# Patient Record
Sex: Male | Born: 1990 | Race: White | Hispanic: No | Smoking: Current every day smoker
Health system: Southern US, Community
[De-identification: ages and names within clinical notes are randomized; demographics above are authoritative.]

## PROBLEM LIST (undated history)

## (undated) DIAGNOSIS — N289 Disorder of kidney and ureter, unspecified: Secondary | ICD-10-CM

## (undated) HISTORY — PX: CYST REMOVAL TRUNK: SHX6283

## (undated) HISTORY — PX: CYSTOSCOPY WITH STENT PLACEMENT: SHX5790

---

## 2016-07-02 ENCOUNTER — Emergency Department (HOSPITAL_COMMUNITY): Payer: Non-veteran care

## 2016-07-02 ENCOUNTER — Emergency Department (HOSPITAL_COMMUNITY)
Admission: EM | Admit: 2016-07-02 | Discharge: 2016-07-02 | Disposition: A | Discharge status: Home / Self Care | Payer: Non-veteran care | Attending: Emergency Medicine | Admitting: Emergency Medicine

## 2016-07-02 ENCOUNTER — Encounter (HOSPITAL_COMMUNITY): Payer: Self-pay | Admitting: *Deleted

## 2016-07-02 DIAGNOSIS — F17210 Nicotine dependence, cigarettes, uncomplicated: Secondary | ICD-10-CM | POA: Diagnosis not present

## 2016-07-02 DIAGNOSIS — Z79899 Other long term (current) drug therapy: Secondary | ICD-10-CM | POA: Insufficient documentation

## 2016-07-02 DIAGNOSIS — R109 Unspecified abdominal pain: Secondary | ICD-10-CM | POA: Diagnosis not present

## 2016-07-02 HISTORY — DX: Disorder of kidney and ureter, unspecified: N28.9

## 2016-07-02 LAB — URINALYSIS, ROUTINE W REFLEX MICROSCOPIC
BILIRUBIN URINE: NEGATIVE
GLUCOSE, UA: NEGATIVE mg/dL
Hgb urine dipstick: NEGATIVE
Ketones, ur: NEGATIVE mg/dL
Leukocytes, UA: NEGATIVE
Nitrite: NEGATIVE
PH: 6 (ref 5.0–8.0)
Protein, ur: NEGATIVE mg/dL
SPECIFIC GRAVITY, URINE: 1.025 (ref 1.005–1.030)

## 2016-07-02 MED ORDER — NAPROXEN 500 MG PO TABS: 500.0000 mg | ORAL_TABLET | Freq: Two times a day (BID) | ORAL | 0 refills | Status: AC

## 2016-07-02 MED ORDER — TRAMADOL HCL 50 MG PO TABS: 50.0000 mg | ORAL_TABLET | Freq: Four times a day (QID) | ORAL | 0 refills | Status: AC | PRN

## 2016-07-02 MED ORDER — IBUPROFEN 800 MG PO TABS
800.0000 mg | ORAL_TABLET | Freq: Once | ORAL | Status: AC
  Administered 2016-07-02: 800 mg via ORAL
  Filled 2016-07-02: qty 1

## 2016-07-02 NOTE — Discharge Instructions (Signed)
Apply ice several times a day. Take acetaminophen as needed.

## 2016-07-02 NOTE — ED Triage Notes (Signed)
Pt c/o left flank pain that started today; pt denies any urinary sx

## 2016-07-02 NOTE — ED Provider Notes (Signed)
[facility]EM[facility]ENCY DEPT Provider Note   CSN: [id number] Arrival date & time: 07/02/16  0403     History   Chief Complaint Chief Complaint  Patient presents with  . Flank Pain    HPI Thomas Vance is a 25 y.o. male.He has a history of kidney stones and has been having left flank pain since 6 PM which is typical of his kidney stones. Pain is worse with movement or with coughing. He rates pain at 6/10. There is no radiation of pain. There's no associated nausea or vomiting. He denies any dysuria. He has not taken any medication for.  The history is provided by the patient.    Past Medical History:  Diagnosis Date  . Renal disorder    kidney stones    There are no active problems to display for this patient.   Past Surgical History:  Procedure Laterality Date  . CYST REMOVAL TRUNK    . CYSTOSCOPY WITH STENT PLACEMENT Bilateral        Home Medications    Prior to Admission medications   Medication Sig Start Date End Date Taking? Authorizing Provider  allopurinol (ZYLOPRIM) 100 MG tablet Take 100 mg by mouth daily.   Yes Historical Provider, MD  gabapentin (NEURONTIN) 300 MG capsule Take 300 mg by mouth 3 (three) times daily.   Yes Historical Provider, MD  lithium carbonate 300 MG capsule Take 300 mg by mouth 3 (three) times daily with meals.   Yes Historical Provider, MD    Family History History reviewed. No pertinent family history.  Social History Social History  Substance Use Topics  . Smoking status: Current Every Day Smoker    Packs/day: 1.00    Types: Cigarettes  . Smokeless tobacco: Current User    Types: Chew  . Alcohol use Yes     Comment: occasionally     Allergies   Review of patient's allergies indicates no known allergies.   Review of Systems Review of Systems  All other systems reviewed and are negative.    Physical Exam Updated Vital Signs BP 135/90 (BP Location: Left Arm)   Pulse 115   Temp 98.2 F (36.8 C) (Oral)   Resp 20    Ht 6' (1.829 m)   Wt 270 lb (122.5 kg)   SpO2 96%   BMI 36.62 kg/m   Physical Exam  Nursing note and vitals reviewed.  25 year old male, resting comfortably and in no acute distress. Vital signs are significant for tachycardia. Oxygen saturation is 96 %, which is normal. Head is normocephalic and atraumatic. PERRLA, EOMI. Oropharynx is clear. Neck is nontender and supple without adenopathy or JVD. Back is nontender and there is no CVA tenderness. Lungs are clear without rales, wheezes, or rhonchi. Chest is nontender. Heart has regular rate and rhythm without murmur. Abdomen is soft, flat, nontender without masses or hepatosplenomegaly and peristalsis is normoactive. Extremities have no cyanosis or edema, full range of motion is present. Skin is warm and dry without rash. Neurologic: Mental status is normal, cranial nerves are intact, there are no motor or sensory deficits.  ED Treatments / Results  Labs (all labs ordered are listed, but only abnormal results are displayed) Labs Reviewed  URINALYSIS, ROUTINE W REFLEX MICROSCOPIC (NOT AT [facility])    Radiology Ct Renal Stone Study  Result Date: 07/02/2016 CLINICAL DATA:  Left flank pain starting today. Previous history of kidney stones. EXAM: CT ABDOMEN AND PELVIS WITHOUT CONTRAST TECHNIQUE: Multidetector CT imaging of the abdomen and pelvis  was performed following the standard protocol without IV contrast. COMPARISON:  None. FINDINGS: Lower chest: No acute abnormality. Hepatobiliary: Unenhanced appearance is unremarkable. Pancreas: Unenhanced appearance is unremarkable. Spleen: Unenhanced appearance is unremarkable. Adrenals/Urinary Tract: Kidneys are symmetrical in size and shape. No hydronephrosis or hydroureter. No renal, ureteral, or bladder stones. Bladder wall is not thickened. No adrenal gland nodules. Stomach/Bowel: Stomach and small bowel and colon are mostly decompressed. Vascular/Lymphatic: Normal caliber abdominal aorta. No  significant lymphadenopathy. Reproductive: Prostate gland is not enlarged. Other: No free air or free fluid in the abdomen. Musculoskeletal: No destructive bone lesions. IMPRESSION: No renal or ureteral stone or obstruction. No acute process identified on noncontrast imaging. Electronically Signed   By: Burman NievesWilliam  Stevens M.D.   On: 07/02/2016 05:15    Procedures Procedures (including critical care time)  Medications Ordered in ED Medications  ibuprofen (ADVIL,MOTRIN) tablet 800 mg (not administered)     Initial Impression / Assessment and Plan / ED Course  I have reviewed the triage vital signs and the nursing notes.  Pertinent labs & imaging results that were available during my care of the patient were reviewed by me and considered in my medical decision making (see chart for details).  Clinical Course   Left flank pain, rule out renal colic. He is sent for CT renal stone protocol which shows no evidence of urolithiasis. Urinalysis is completely normal. Pain is most likely musculoskeletal. He is discharged with prescriptions for naproxen and tramadol. Referred back to his PCP for follow-up.  Final Clinical Impressions(s) / ED Diagnoses   Final diagnoses:  Left flank pain    New Prescriptions New Prescriptions   NAPROXEN (NAPROSYN) 500 MG TABLET    Take 1 tablet (500 mg total) by mouth 2 (two) times daily.   TRAMADOL (ULTRAM) 50 MG TABLET    Take 1 tablet (50 mg total) by mouth every 6 (six) hours as needed.     Dione Boozeavid Nalany Steedley, MD 07/02/16 [id number]

## 2017-08-19 IMAGING — CT CT RENAL STONE PROTOCOL
2 of 3 series · 16 of 46 positions shown, 18 images · non-contrast
Comparison: None.

CLINICAL DATA: Left flank pain starting today. Previous history of
kidney stones.

EXAM:
CT ABDOMEN AND PELVIS WITHOUT CONTRAST
TECHNIQUE: Multidetector CT imaging of the abdomen and pelvis was performed
following the standard protocol without IV contrast.

[Series 2: routine abd pel with · axial · 0.89mm/px · z∈[-598,-38]mm · 13 of 130 slices shown, 15 images]
[im 9/130  soft-tissue]
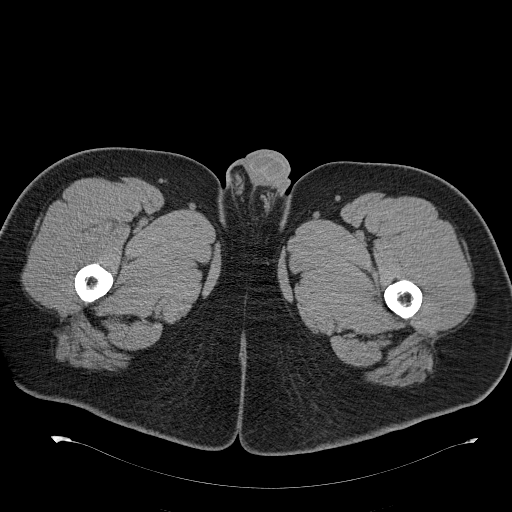
[im 9/130  bone]
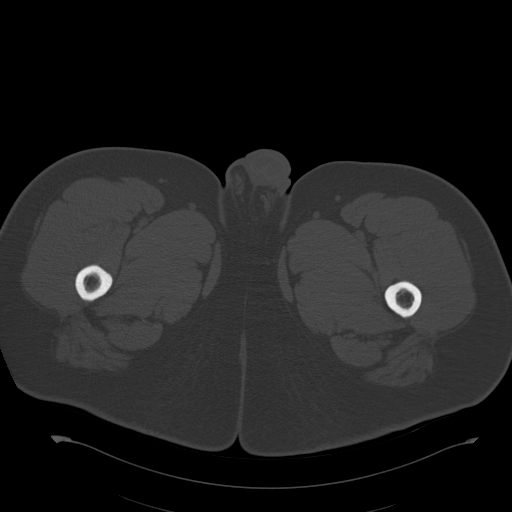
[im 17/130  soft-tissue]
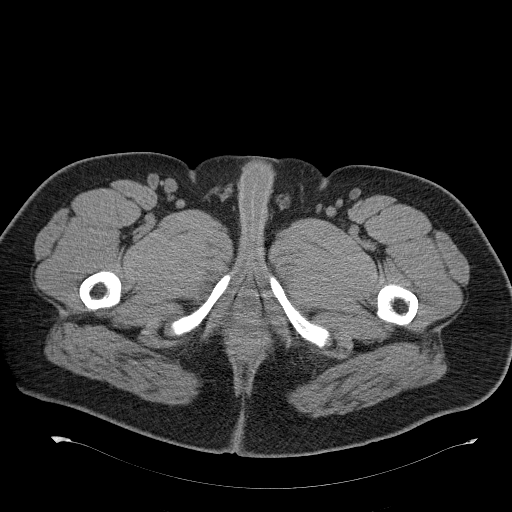
[im 25/130  soft-tissue]
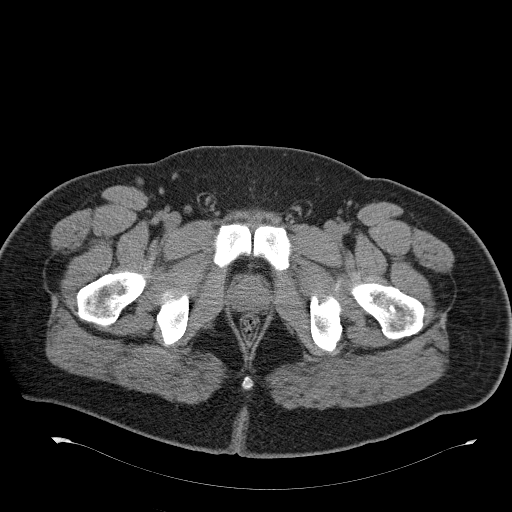
[im 38/130  soft-tissue]
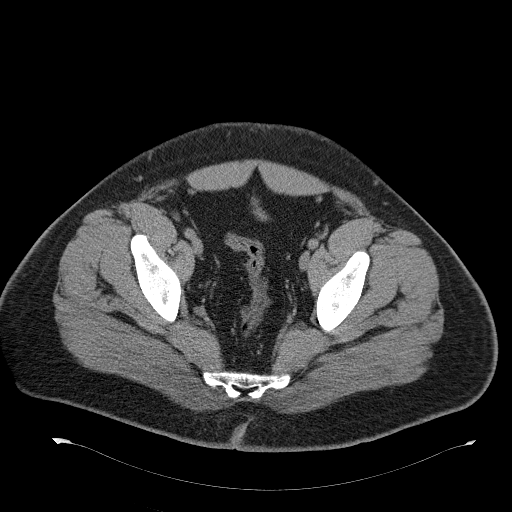
[im 46/130  soft-tissue]
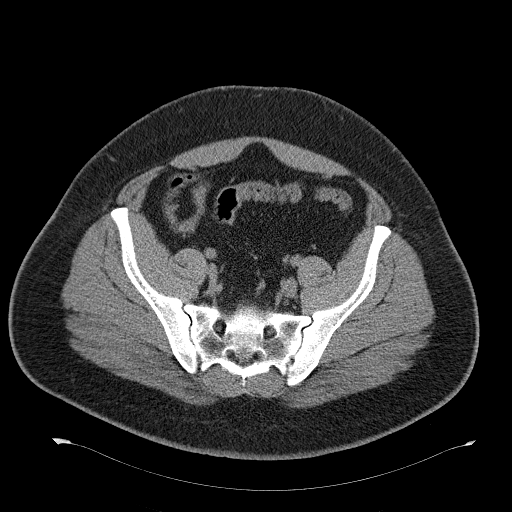
[im 55/130  soft-tissue]
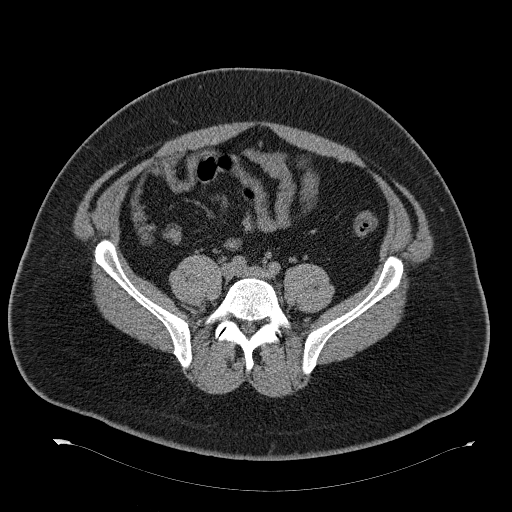
[im 67/130  soft-tissue]
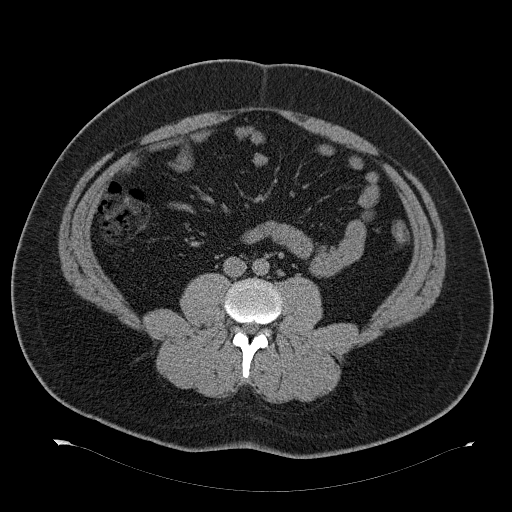
[im 75/130  soft-tissue]
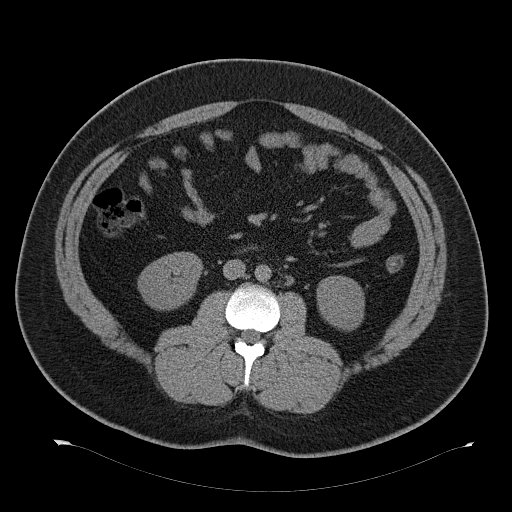
[im 84/130  soft-tissue]
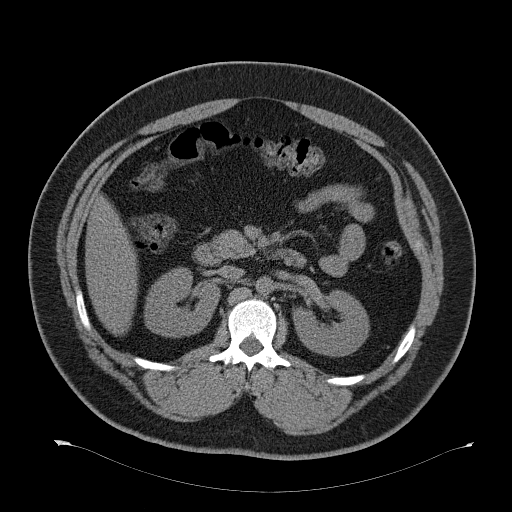
[im 84/130  bone]
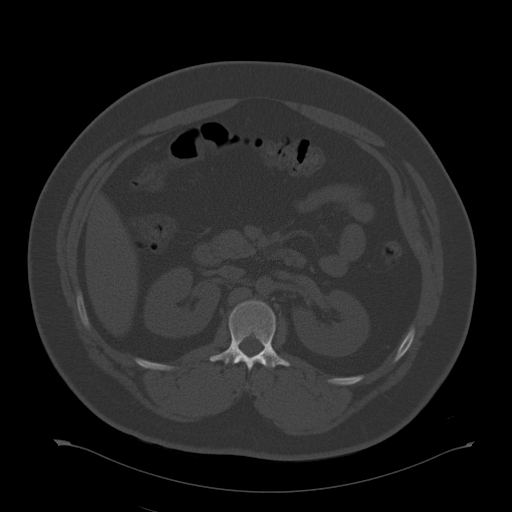
[im 92/130  soft-tissue]
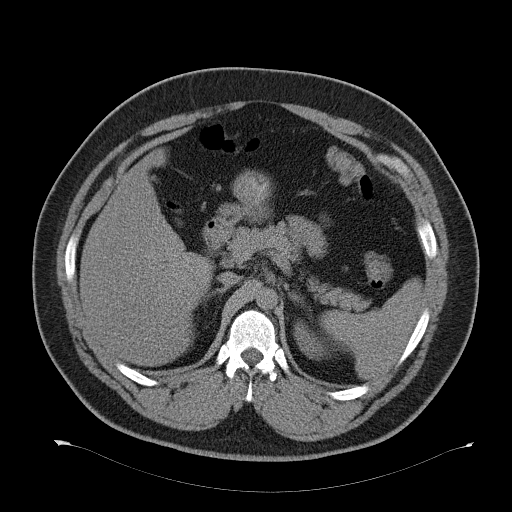
[im 105/130  soft-tissue]
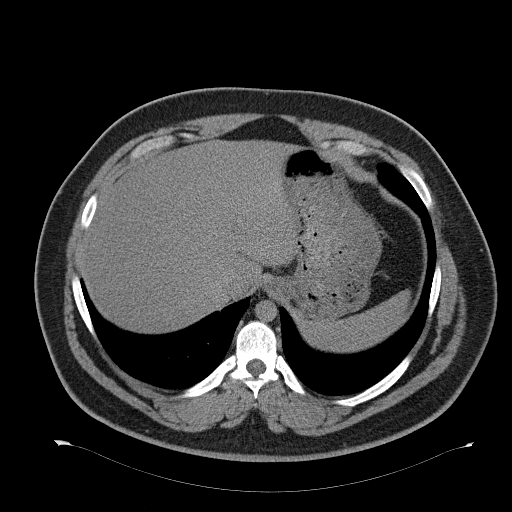
[im 113/130  soft-tissue]
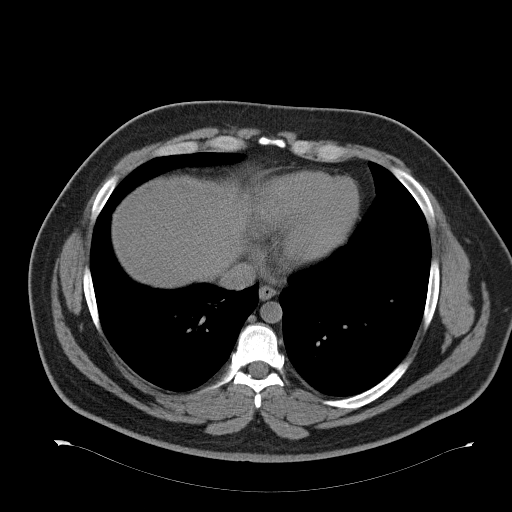
[im 121/130  soft-tissue]
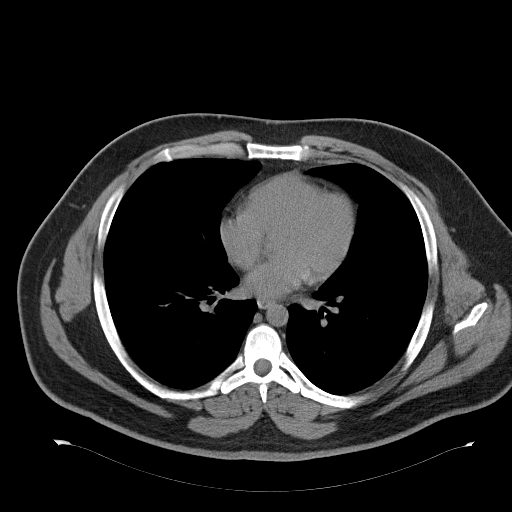

[Series 3: coronal · coronal · 0.90mm/px · 3 of 184 slices shown]
[im 62/184  soft-tissue]
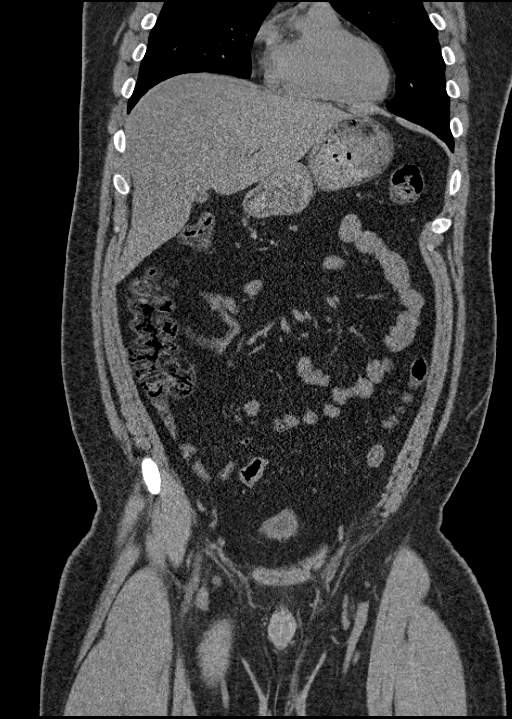
[im 82/184  soft-tissue]
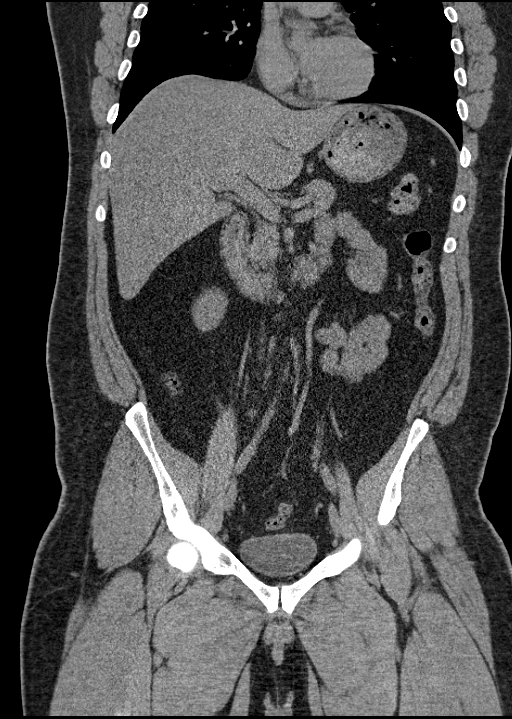
[im 102/184  soft-tissue]
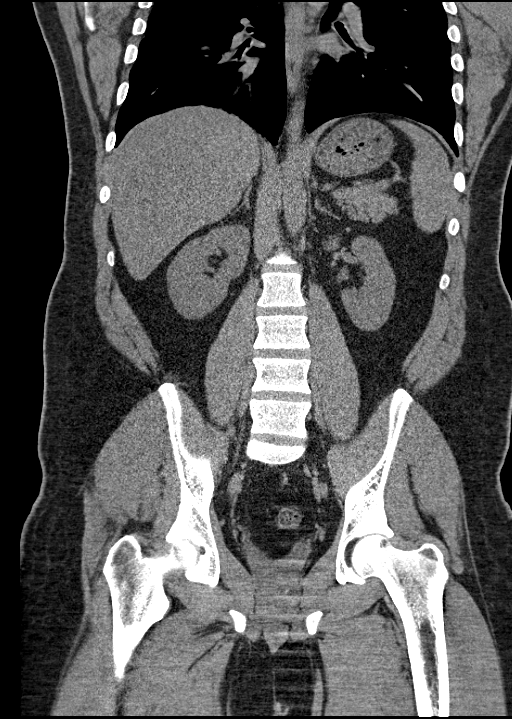

[16 of 46 positions shown; findings below may reference images not displayed]

FINDINGS: Lower chest: No acute abnormality.

Hepatobiliary: Unenhanced appearance is unremarkable.

Pancreas: Unenhanced appearance is unremarkable.

Spleen: Unenhanced appearance is unremarkable.

Adrenals/Urinary Tract: Kidneys are symmetrical in size and shape.
No hydronephrosis or hydroureter. No renal, ureteral, or bladder
stones. Bladder wall is not thickened. No adrenal gland nodules.

Stomach/Bowel: Stomach and small bowel and colon are mostly
decompressed.

Vascular/Lymphatic: Normal caliber abdominal aorta. No significant
lymphadenopathy.

Reproductive: Prostate gland is not enlarged.

Other: No free air or free fluid in the abdomen.

Musculoskeletal: No destructive bone lesions.
IMPRESSION: No renal or ureteral stone or obstruction. No acute process
identified on noncontrast imaging.
